# Patient Record
Sex: Male | Born: 1980 | Race: Black or African American | Hispanic: No | Marital: Single | Smoking: Never smoker
Health system: Southern US, Community
[De-identification: ages and names within clinical notes are randomized; demographics above are authoritative.]

---

## 2020-09-13 ENCOUNTER — Emergency Department (HOSPITAL_BASED_OUTPATIENT_CLINIC_OR_DEPARTMENT_OTHER)
Admission: EM | Admit: 2020-09-13 | Discharge: 2020-09-13 | Disposition: A | Discharge status: Home / Self Care | Payer: BC Managed Care – PPO | Attending: Emergency Medicine | Admitting: Emergency Medicine

## 2020-09-13 ENCOUNTER — Encounter (HOSPITAL_BASED_OUTPATIENT_CLINIC_OR_DEPARTMENT_OTHER): Payer: Self-pay

## 2020-09-13 ENCOUNTER — Other Ambulatory Visit: Payer: Self-pay

## 2020-09-13 DIAGNOSIS — M5441 Lumbago with sciatica, right side: Secondary | ICD-10-CM | POA: Diagnosis not present

## 2020-09-13 DIAGNOSIS — M545 Low back pain, unspecified: Secondary | ICD-10-CM | POA: Diagnosis present

## 2020-09-13 DIAGNOSIS — M5442 Lumbago with sciatica, left side: Secondary | ICD-10-CM | POA: Diagnosis not present

## 2020-09-13 MED ORDER — KETOROLAC TROMETHAMINE 60 MG/2ML IM SOLN
30.0000 mg | Freq: Once | INTRAMUSCULAR | Status: AC
  Administered 2020-09-13: 30 mg via INTRAMUSCULAR
  Filled 2020-09-13: qty 2

## 2020-09-13 MED ORDER — CYCLOBENZAPRINE HCL 10 MG PO TABS: 10.0000 mg | ORAL_TABLET | Freq: Two times a day (BID) | ORAL | 0 refills | Status: AC | PRN

## 2020-09-13 MED ORDER — HYDROMORPHONE HCL 1 MG/ML IJ SOLN
1.0000 mg | Freq: Once | INTRAMUSCULAR | Status: AC
Start: 2020-09-13 — End: 2020-09-13
  Administered 2020-09-13: 1 mg via SUBCUTANEOUS
  Filled 2020-09-13: qty 1

## 2020-09-13 MED ORDER — PREDNISONE 10 MG (21) PO TBPK: ORAL_TABLET | Freq: Every day | ORAL | 0 refills | Status: AC

## 2020-09-13 NOTE — ED Provider Notes (Signed)
[facility] EMERGENCY DEPARTMENT Provider Note   CSN: [id number] Arrival date & time: 09/13/20  1229     History Chief Complaint  Patient presents with  . Back Pain    John Henry is a 40 y.o. male.  Presents to ER with concern for low back pain.  Patient states that over the last few weeks has been having low back pain, thinks this occurred after he strained his back but did not have any associated trauma.  Went to [facility] ER, got a shot of steroids in the ER, sent home with some medicine but they do not seem to be helping.  Unsure exactly what medicine he was giving.  States he was referred to someone in [place] and was supposed to get an outpatient MRI but did not complete this.  Is interested in establishing care in [place].  States pain sharp stabbing, radiates from right low back to right leg.  Has been able to walk.  No associated numbness, weakness, bladder or bowel incontinence, urinary retention, fevers.  Denies prior history IVDU.  No prior back surgeries.  States he had x-rays of his spine performed in [place] which were normal.  HPI     No past medical history on file.  There are no problems to display for this patient.   History reviewed. No pertinent family history.  Social History   Tobacco Use  . Smoking status: Never Smoker  . Smokeless tobacco: Never Used    Home Medications Prior to Admission medications   Medication Sig Start Date End Date Taking? Authorizing Provider  cyclobenzaprine (FLEXERIL) 10 MG tablet Take 1 tablet (10 mg total) by mouth 2 (two) times daily as needed for muscle spasms. 09/13/20  Yes Milagros Loll, MD  predniSONE (STERAPRED UNI-PAK 21 TAB) 10 MG (21) TBPK tablet Take by mouth daily. Take 6 tabs by mouth daily  for 1 day, then 5 tabs for 1 day, then 4 tabs for 1 day, then 3 tabs for 1 day, 2 tabs for 1 day, then 1 tab by mouth daily for 1 day 09/13/20  Yes Milagros Loll, MD    Allergies     Patient has no known allergies.  Review of Systems   Review of Systems  Constitutional: Negative for chills and fever.  HENT: Negative for ear pain and sore throat.   Eyes: Negative for pain and visual disturbance.  Respiratory: Negative for cough and shortness of breath.   Cardiovascular: Negative for chest pain and palpitations.  Gastrointestinal: Negative for abdominal pain and vomiting.  Genitourinary: Negative for dysuria and hematuria.  Musculoskeletal: Positive for arthralgias and back pain.  Skin: Negative for color change and rash.  Neurological: Negative for seizures and syncope.  All other systems reviewed and are negative.   Physical Exam Updated Vital Signs BP (!) 136/92 (BP Location: Right Arm)   Pulse 98   Temp 98 F (36.7 C) (Oral)   Resp 16   SpO2 100%   Physical Exam Vitals and nursing note reviewed.  Constitutional:      Appearance: He is well-developed and well-nourished.  HENT:     Head: Normocephalic and atraumatic.  Eyes:     Conjunctiva/sclera: Conjunctivae normal.  Cardiovascular:     Rate and Rhythm: Normal rate.     Pulses: Normal pulses.  Pulmonary:     Effort: Pulmonary effort is normal. No respiratory distress.  Abdominal:     Palpations: Abdomen is soft.     Tenderness: There is  no abdominal tenderness.  Musculoskeletal:        General: No edema.     Cervical back: Neck supple.     Comments: Some tenderness to palpation across lumbar region but no midline tenderness, no step-off or deformity noted  Skin:    General: Skin is warm and dry.  Neurological:     Mental Status: He is alert.     Comments: 5 out of 5 strength in bilateral lower extremities, sensation to light touch intact in bilateral lower extremities  Psychiatric:        Mood and Affect: Mood and affect normal.     ED Results / Procedures / Treatments   Labs (all labs ordered are listed, but only abnormal results are displayed) Labs Reviewed - No data to  display  EKG None  Radiology No results found.  Procedures Procedures   Medications Ordered in ED Medications  ketorolac (TORADOL) injection 30 mg (30 mg Intramuscular Given 09/13/20 1400)  HYDROmorphone (DILAUDID) injection 1 mg (1 mg Subcutaneous Given 09/13/20 1355)    ED Course  I have reviewed the triage vital signs and the nursing notes.  Pertinent labs & imaging results that were available during my care of the patient were reviewed by me and considered in my medical decision making (see chart for details).    MDM Rules/Calculators/A&P                         40 year old male presenting to ER with concern for back pain in the lumbar region radiating to right lower extremity.  On exam he is well-appearing, neurovascularly intact.  No red flag symptoms per history.  Suspect sciatica.  Recommend follow-up with neurosurgery outpatient.  Will provide course of steroid taper.   After the discussed management above, the patient was determined to be safe for discharge.  The patient was in agreement with this plan and all questions regarding their care were answered.  ED return precautions were discussed and the patient will return to the ED with any significant worsening of condition.   Final Clinical Impression(s) / ED Diagnoses Final diagnoses:  Acute right-sided low back pain with bilateral sciatica    Rx / DC Orders ED Discharge Orders         Ordered    cyclobenzaprine (FLEXERIL) 10 MG tablet  2 times daily PRN        09/13/20 1346    predniSONE (STERAPRED UNI-PAK 21 TAB) 10 MG (21) TBPK tablet  Daily        09/13/20 1346           Milagros Loll, MD 09/14/20 [phone]

## 2020-09-13 NOTE — ED Triage Notes (Signed)
He c/o right-sided low back pain radiating into his right thigh x ~5 weeks. He tells me the feeling in his feet and all toes bilat. Is "normal". He also states he was seen at [facility] a few days ago "but those medicines didn't help this at all". He is ambulatory.

## 2020-09-13 NOTE — Discharge Instructions (Signed)
Recommend taking v the prescribed steroid taper.  Follow-up with neurosurgery.  Return to emergency room if you develop uncontrolled pain, bladder or bowel incontinence, fever, numbness or weakness.

## 2020-09-22 ENCOUNTER — Telehealth: Payer: Self-pay

## 2020-09-22 ENCOUNTER — Other Ambulatory Visit: Payer: Self-pay | Admitting: Neurological Surgery

## 2020-09-22 DIAGNOSIS — M5416 Radiculopathy, lumbar region: Secondary | ICD-10-CM

## 2020-09-22 NOTE — Telephone Encounter (Signed)
Phone call to patient to verify medication list and allergies for myelogram procedure. Medications pt is currently taking are safe to continue to take. Advised pt if any new medications are started prior to procedure to call and make [place] aware. Pt also instructed to have a driver the day of the procedure, he would be with [place] around 2 hours the day, and discharge instructions. Pt verbalized understanding.

## 2020-09-25 ENCOUNTER — Other Ambulatory Visit: Payer: Self-pay

## 2020-09-25 ENCOUNTER — Ambulatory Visit
Admission: RE | Admit: 2020-09-25 | Discharge: 2020-09-25 | Disposition: A | Discharge status: Home / Self Care | Payer: BC Managed Care – PPO | Source: Ambulatory Visit | Attending: Neurological Surgery | Admitting: Neurological Surgery

## 2020-09-25 DIAGNOSIS — M5416 Radiculopathy, lumbar region: Secondary | ICD-10-CM

## 2020-09-25 MED ORDER — DIAZEPAM 5 MG PO TABS
10.0000 mg | ORAL_TABLET | Freq: Once | ORAL | Status: AC
  Administered 2020-09-25: 10 mg via ORAL

## 2020-09-25 MED ORDER — IOPAMIDOL (ISOVUE-M 200) INJECTION 41%
15.0000 mL | Freq: Once | INTRAMUSCULAR | Status: AC
  Administered 2020-09-25: 15 mL via INTRATHECAL

## 2020-09-25 NOTE — Discharge Instructions (Signed)

## 2022-02-01 IMAGING — CT CT L SPINE W/ CM
1 of 7 series · 6 of 14 positions shown, 8 images · IV contrast (isovue)
Comparison: Lumbar spine radiographs 09/18/2020

CLINICAL DATA: Right low back pain radiating into the anterior and
lateral right thigh.

EXAM:
CT MYELOGRAPHY LUMBAR SPINE
TECHNIQUE: CT imaging of the lumbar spine was performed after Isovue 200M
contrast administration. Multiplanar CT image reconstructions were
also generated.

[Series 3: l spine soft · axial · 0.32mm/px · z∈[-349,-179]mm · 6 of 121 slices shown, 8 images]
[im 18/121  soft-tissue]
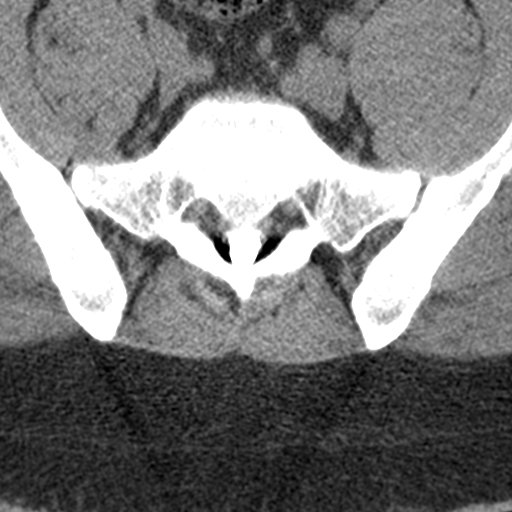
[im 18/121  bone]
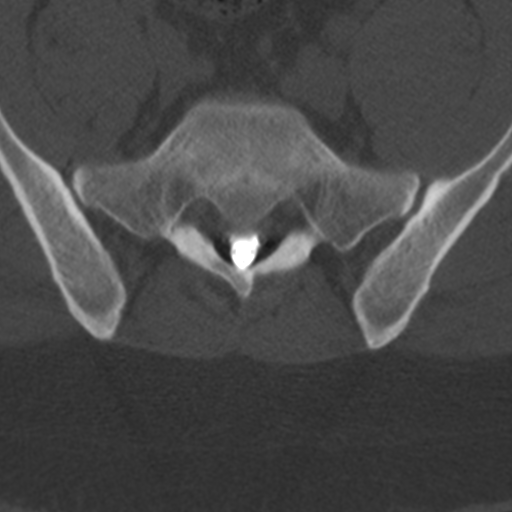
[im 35/121  bone]
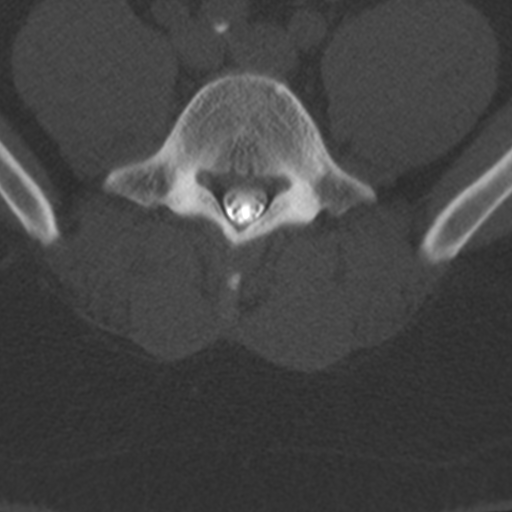
[im 52/121  bone]
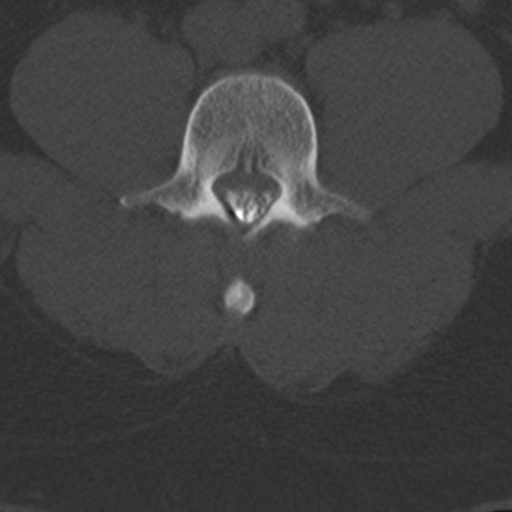
[im 69/121  bone]
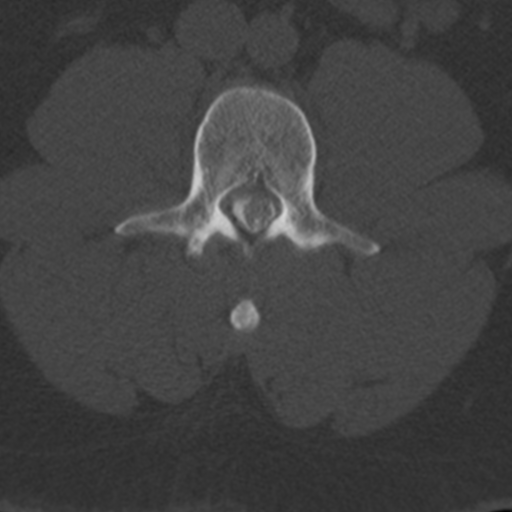
[im 86/121  soft-tissue]
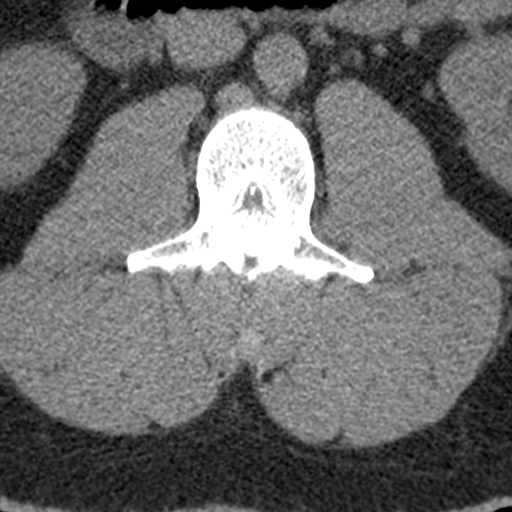
[im 86/121  bone]
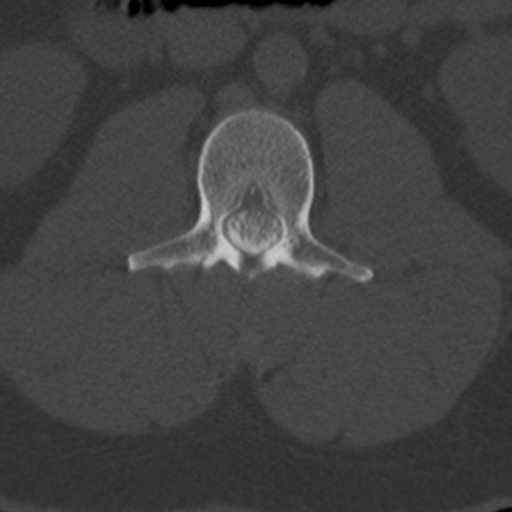
[im 103/121  bone]
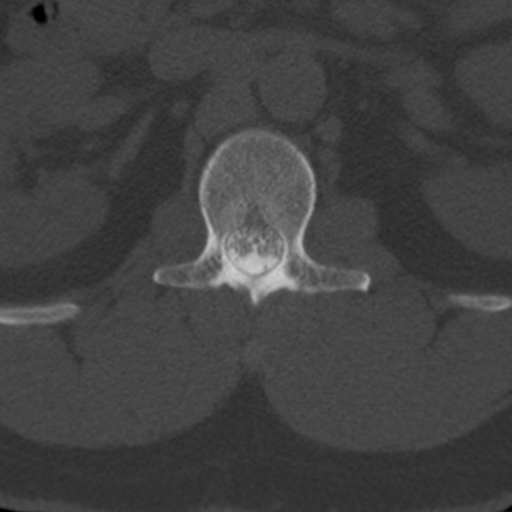

[6 of 14 positions shown; findings below may reference images not displayed]

FINDINGS: Lumbar puncture for intrathecal contrast administration and
acquisition of conventional myelogram images were performed by Dr.
Khel and reported separately.

Segmentation:  5 lumbar type vertebral bodies.

Alignment: Trace retrolisthesis of L2 on L3, L3 on L4, and L4 on L5.

Vertebrae: No fracture, suspicious osseous lesion, or significant
marrow edema.

Conus medullaris: Extends to the L1 level and appears normal.

Paraspinal and other soft tissues: Mild aortoiliac atherosclerotic
calcification.

Disc levels:

T12-L1 through L2-3: Negative.

L3-4: Moderate disc space narrowing. Soft tissue in the right
lateral recess extending dorsally in the right lateral epidural
space as well as laterally into the neural foramen is compatible
with a large disc extrusion. There is superior migration to the mid
L3 vertebral body level with a small amount of associated gas at the
superior most aspect of the extrusion. This results in severe right
lateral recess and severe right neural foraminal stenosis with
impingement of the right L3 and potentially L4 nerve roots. There is
moderate overall spinal stenosis.

L4-5: Mild disc space narrowing. Disc bulging and mild endplate
spurring result in mild-to-moderate bilateral neural foraminal
stenosis without spinal stenosis.

L5-S1: Small central disc protrusion and mild left facet hypertrophy
without stenosis.
IMPRESSION: 1. Large right-sided disc extrusion at L3-4 with severe right
lateral recess and severe right neural foraminal stenosis.
2. Mild-to-moderate bilateral neural foraminal stenosis at L4-5.
3. Small central disc protrusion at L5-S1 without stenosis.
4. Aortic Atherosclerosis ([id number]).
